# Patient Record
Sex: Male | Born: 1995 | Race: White | Hispanic: No | Marital: Single | Smoking: Never smoker
Health system: Southern US, Community
[De-identification: ages and names within clinical notes are randomized; demographics above are authoritative.]

## PROBLEM LIST (undated history)

## (undated) DIAGNOSIS — W3400XA Accidental discharge from unspecified firearms or gun, initial encounter: Secondary | ICD-10-CM

## (undated) DIAGNOSIS — G039 Meningitis, unspecified: Secondary | ICD-10-CM

---

## 2018-02-24 ENCOUNTER — Encounter (HOSPITAL_COMMUNITY): Payer: Self-pay | Admitting: Emergency Medicine

## 2018-02-24 ENCOUNTER — Ambulatory Visit (HOSPITAL_COMMUNITY)
Admission: EM | Admit: 2018-02-24 | Discharge: 2018-02-24 | Disposition: A | Discharge status: Home / Self Care | Payer: Self-pay | Attending: Internal Medicine | Admitting: Internal Medicine

## 2018-02-24 ENCOUNTER — Other Ambulatory Visit: Payer: Self-pay

## 2018-02-24 DIAGNOSIS — J069 Acute upper respiratory infection, unspecified: Secondary | ICD-10-CM

## 2018-02-24 DIAGNOSIS — B9789 Other viral agents as the cause of diseases classified elsewhere: Secondary | ICD-10-CM

## 2018-02-24 HISTORY — DX: Meningitis, unspecified: G03.9

## 2018-02-24 MED ORDER — CETIRIZINE HCL 10 MG PO CAPS: 10.0000 mg | ORAL_CAPSULE | Freq: Every day | ORAL | 0 refills | Status: AC

## 2018-02-24 MED ORDER — FLUTICASONE PROPIONATE 50 MCG/ACT NA SUSP: 1.0000 | Freq: Every day | NASAL | 0 refills | Status: AC

## 2018-02-24 MED ORDER — PSEUDOEPH-BROMPHEN-DM 30-2-10 MG/5ML PO SYRP: 5.0000 mL | ORAL_SOLUTION | Freq: Four times a day (QID) | ORAL | 0 refills | Status: AC | PRN

## 2018-02-24 NOTE — ED Provider Notes (Signed)
[facility]    CSN: [id number] Arrival date & time: 02/24/18  1217     History   Chief Complaint Chief Complaint  Patient presents with  . Cough    HPI Jonathon Ramirez is a 22 y.o. male Patient is presenting with URI symptoms- congestion, cough, minimal sore throat. Patient's main complaints are cough and congestion. Symptoms have been going on for 2 days. Patient has tried NyQuil, with minimal relief. Denies nausea, vomiting, diarrhea.  Endorsing subjective fevers.  Denies shortness of breath and chest pain.  Denies history of asthma or smoking.   HPI  Past Medical History:  Diagnosis Date  . Meningitis     There are no active problems to display for this patient.   History reviewed. No pertinent surgical history.     Home Medications    Prior to Admission medications   Medication Sig Start Date End Date Taking? Authorizing Provider  brompheniramine-pseudoephedrine-DM 30-2-10 MG/5ML syrup Take 5 mLs by mouth 4 (four) times daily as needed. 02/24/18   Andra Heslin C, PA-C  Cetirizine HCl 10 MG CAPS Take 1 capsule (10 mg total) by mouth daily for 15 days. 02/24/18 03/11/18  Tinya Cadogan C, PA-C  fluticasone (FLONASE) 50 MCG/ACT nasal spray Place 1-2 sprays into both nostrils daily for 7 days. 02/24/18 03/03/18  Shayra Anton, Junius CreamerHallie C, PA-C    Family History No family history on file.  Social History Social History   Tobacco Use  . Smoking status: Never Smoker  . Smokeless tobacco: Never Used  Substance Use Topics  . Alcohol use: Never    Frequency: Never  . Drug use: Never     Allergies   Vancomycin   Review of Systems Review of Systems  Constitutional: Positive for fever. Negative for activity change, appetite change and fatigue.  HENT: Positive for congestion, postnasal drip and rhinorrhea. Negative for ear pain, sinus pressure and sore throat.   Eyes: Negative for pain and itching.  Respiratory: Positive for cough. Negative for shortness of  breath.   Cardiovascular: Negative for chest pain.  Gastrointestinal: Negative for abdominal pain, diarrhea, nausea and vomiting.  Musculoskeletal: Negative for myalgias.  Skin: Negative for rash.  Neurological: Negative for dizziness, light-headedness and headaches.     Physical Exam Triage Vital Signs ED Triage Vitals  Enc Vitals Group     BP 02/24/18 1302 112/74     Pulse Rate 02/24/18 1302 (!) 115     Resp --      Temp 02/24/18 1302 98.4 F (36.9 C)     Temp Source 02/24/18 1302 Oral     SpO2 02/24/18 1302 98 %     Weight --      Height --      Head Circumference --      Peak Flow --      Pain Score 02/24/18 1300 0     Pain Loc --      Pain Edu? --      Excl. in GC? --    No data found.  Updated Vital Signs BP 112/74 (BP Location: Left Arm)   Pulse (!) 115   Temp 98.4 F (36.9 C) (Oral)   SpO2 98%   Visual Acuity Right Eye Distance:   Left Eye Distance:   Bilateral Distance:    Right Eye Near:   Left Eye Near:    Bilateral Near:     Physical Exam  Constitutional: He appears well-developed and well-nourished.  HENT:  Head: Normocephalic and atraumatic.  Bilateral TMs nonerythematous, nasal mucosa erythematous with swollen turbinates, clear rhinorrhea present in bilateral nares, posterior oropharynx erythematous, no tonsillar enlargement or exudate.  Eyes: Conjunctivae are normal.  Neck: Neck supple.  Cardiovascular: Normal rate and regular rhythm.  No murmur heard. Pulmonary/Chest: Effort normal and breath sounds normal. No respiratory distress.  Breathing comfortably at rest, CTA BL, no adventitious sounds auscultated.  Abdominal: Soft. There is no tenderness.  Musculoskeletal: He exhibits no edema.  Neurological: He is alert.  Skin: Skin is warm and dry.  Psychiatric: He has a normal mood and affect.  Nursing note and vitals reviewed.    UC Treatments / Results  Labs (all labs ordered are listed, but only abnormal results are displayed) Labs  Reviewed - No data to display  EKG None Radiology No results found.  Procedures Procedures (including critical care time)  Medications Ordered in UC Medications - No data to display   Initial Impression / Assessment and Plan / UC Course  I have reviewed the triage vital signs and the nursing notes.  Pertinent labs & imaging results that were available during my care of the patient were reviewed by me and considered in my medical decision making (see chart for details).     Patient likely with viral URI versus allergic rhinitis.  Will begin on daily Zyrtec, Flonase nasal spray.  Also provided cough syrup to use as needed for cough.  Patient is self-pay and, provided good Rx card but also discussed over-the-counter medications to use as alternative. Discussed strict return precautions. Patient verbalized understanding and is agreeable with plan.   Final Clinical Impressions(s) / UC Diagnoses   Final diagnoses:  Viral URI with cough    ED Discharge Orders        Ordered    Cetirizine HCl 10 MG CAPS  Daily     02/24/18 1321    fluticasone (FLONASE) 50 MCG/ACT nasal spray  Daily     02/24/18 1321    brompheniramine-pseudoephedrine-DM 30-2-10 MG/5ML syrup  4 times daily PRN     02/24/18 1321       Controlled Substance Prescriptions [place] Controlled Substance Registry consulted? Not Applicable   Lew Dawes, [place] 02/24/18 1325

## 2018-02-24 NOTE — ED Triage Notes (Signed)
C/o productive cough, head congestion with rhinitis onset 2 days

## 2018-02-24 NOTE — Discharge Instructions (Signed)
For congestion please begin daily allergy pill like Zyrtec, Claritin, Allegra or he may get store brand.  Please use in combination with nasal spray like Flonase or Nasacort.  Your cough I sent in a cough syrup for you, or you may try over-the-counter Delsym, Robitussin or Robitussin-DM.  Please also use honey mixed in warm tea to help with cough and relieve any throat irritation.  I expect your symptoms to gradually improve over the next week with consistent use of medications above.  Please return if symptoms worsening, developing new symptoms are not improving in 1 week.

## 2018-05-07 ENCOUNTER — Emergency Department (HOSPITAL_COMMUNITY): Payer: Self-pay

## 2018-05-07 ENCOUNTER — Emergency Department (HOSPITAL_COMMUNITY)
Admission: EM | Admit: 2018-05-07 | Discharge: 2018-05-08 | Disposition: A | Discharge status: Home / Self Care | Payer: Self-pay | Attending: Emergency Medicine | Admitting: Emergency Medicine

## 2018-05-07 ENCOUNTER — Encounter (HOSPITAL_COMMUNITY): Payer: Self-pay | Admitting: Emergency Medicine

## 2018-05-07 DIAGNOSIS — Y9279 Other farm location as the place of occurrence of the external cause: Secondary | ICD-10-CM | POA: Insufficient documentation

## 2018-05-07 DIAGNOSIS — Y9389 Activity, other specified: Secondary | ICD-10-CM | POA: Insufficient documentation

## 2018-05-07 DIAGNOSIS — S71132A Puncture wound without foreign body, left thigh, initial encounter: Secondary | ICD-10-CM | POA: Insufficient documentation

## 2018-05-07 DIAGNOSIS — Y998 Other external cause status: Secondary | ICD-10-CM | POA: Insufficient documentation

## 2018-05-07 DIAGNOSIS — W3400XA Accidental discharge from unspecified firearms or gun, initial encounter: Secondary | ICD-10-CM | POA: Insufficient documentation

## 2018-05-07 DIAGNOSIS — Z23 Encounter for immunization: Secondary | ICD-10-CM | POA: Insufficient documentation

## 2018-05-07 LAB — CBC WITH DIFFERENTIAL/PLATELET
Abs Immature Granulocytes: 0.1 10*3/uL (ref 0.0–0.1)
Basophils Absolute: 0.1 10*3/uL (ref 0.0–0.1)
Basophils Relative: 0 %
EOS PCT: 6 %
Eosinophils Absolute: 0.7 10*3/uL (ref 0.0–0.7)
HEMATOCRIT: 42.9 % (ref 39.0–52.0)
HEMOGLOBIN: 14.4 g/dL (ref 13.0–17.0)
Immature Granulocytes: 0 %
LYMPHS ABS: 4.9 10*3/uL — AB (ref 0.7–4.0)
LYMPHS PCT: 39 %
MCH: 32 pg (ref 26.0–34.0)
MCHC: 33.6 g/dL (ref 30.0–36.0)
MCV: 95.3 fL (ref 78.0–100.0)
MONOS PCT: 7 %
Monocytes Absolute: 0.9 10*3/uL (ref 0.1–1.0)
Neutro Abs: 5.9 10*3/uL (ref 1.7–7.7)
Neutrophils Relative %: 48 %
Platelets: 361 10*3/uL (ref 150–400)
RBC: 4.5 MIL/uL (ref 4.22–5.81)
RDW: 11.7 % (ref 11.5–15.5)
WBC: 12.5 10*3/uL — ABNORMAL HIGH (ref 4.0–10.5)

## 2018-05-07 LAB — I-STAT CHEM 8, ED
BUN: 15 mg/dL (ref 6–20)
CREATININE: 1 mg/dL (ref 0.61–1.24)
Calcium, Ion: 1.15 mmol/L (ref 1.15–1.40)
Chloride: 103 mmol/L (ref 98–111)
Glucose, Bld: 113 mg/dL — ABNORMAL HIGH (ref 70–99)
HEMATOCRIT: 42 % (ref 39.0–52.0)
Hemoglobin: 14.3 g/dL (ref 13.0–17.0)
Potassium: 3.6 mmol/L (ref 3.5–5.1)
Sodium: 139 mmol/L (ref 135–145)
TCO2: 25 mmol/L (ref 22–32)

## 2018-05-07 MED ORDER — IOPAMIDOL (ISOVUE-370) INJECTION 76%
100.0000 mL | Freq: Once | INTRAVENOUS | Status: AC | PRN
  Administered 2018-05-07: 100 mL via INTRAVENOUS

## 2018-05-07 MED ORDER — CEFAZOLIN SODIUM-DEXTROSE 2-4 GM/100ML-% IV SOLN
2.0000 g | Freq: Once | INTRAVENOUS | Status: AC
  Administered 2018-05-08: 2 g via INTRAVENOUS
  Filled 2018-05-07: qty 100

## 2018-05-07 MED ORDER — HYDROMORPHONE HCL 1 MG/ML IJ SOLN
1.0000 mg | Freq: Once | INTRAMUSCULAR | Status: DC
  Filled 2018-05-07: qty 1

## 2018-05-07 MED ORDER — TETANUS-DIPHTH-ACELL PERTUSSIS 5-2.5-18.5 LF-MCG/0.5 IM SUSP
0.5000 mL | Freq: Once | INTRAMUSCULAR | Status: AC
  Administered 2018-05-08: 0.5 mL via INTRAMUSCULAR
  Filled 2018-05-07: qty 0.5
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
## 2018-05-08 LAB — TYPE AND SCREEN
ABO/RH(D): A POS
ANTIBODY SCREEN: NEGATIVE

## 2018-05-08 LAB — ABO/RH: ABO/RH(D): A POS

## 2018-05-08 LAB — BASIC METABOLIC PANEL
ANION GAP: 8 (ref 5–15)
BUN: 14 mg/dL (ref 6–20)
CHLORIDE: 107 mmol/L (ref 98–111)
CO2: 22 mmol/L (ref 22–32)
Calcium: 9.1 mg/dL (ref 8.9–10.3)
Creatinine, Ser: 1.1 mg/dL (ref 0.61–1.24)
GFR calc Af Amer: >60 mL/min (ref >60)
GFR calc non Af Amer: >60 mL/min (ref >60)
GLUCOSE: 116 mg/dL — AB (ref 70–99)
Potassium: 3.6 mmol/L (ref 3.5–5.1)
Sodium: 137 mmol/L (ref 135–145)

## 2018-05-08 LAB — ETHANOL: Alcohol, Ethyl (B): <10 mg/dL (ref <10)

## 2018-05-08 MED ORDER — ONDANSETRON 4 MG PO TBDP: 4.0000 mg | ORAL_TABLET | Freq: Three times a day (TID) | ORAL | 0 refills | Status: AC | PRN

## 2018-05-08 MED ORDER — CEPHALEXIN 500 MG PO CAPS: 500.0000 mg | ORAL_CAPSULE | Freq: Three times a day (TID) | ORAL | 0 refills | Status: AC

## 2018-05-08 MED ORDER — HYDROCODONE-ACETAMINOPHEN 5-325 MG PO TABS
2.0000 | ORAL_TABLET | Freq: Once | ORAL | Status: AC
  Administered 2018-05-08: 2 via ORAL
  Filled 2018-05-08: qty 2

## 2018-05-08 MED ORDER — OXYCODONE-ACETAMINOPHEN 5-325 MG PO TABS: 1.0000 | ORAL_TABLET | ORAL | 0 refills | Status: AC | PRN

## 2018-05-08 NOTE — Discharge Instructions (Addendum)
I recommend cleaning the wound with soap and water at least once a day.  Then, apply a gauze followed by a compression dressing.  If you develop any new numbness, tingling, weakness of the leg, return to the ER immediately.

## 2018-05-15 ENCOUNTER — Encounter (HOSPITAL_COMMUNITY): Payer: Self-pay | Admitting: Emergency Medicine

## 2018-05-15 ENCOUNTER — Ambulatory Visit (HOSPITAL_COMMUNITY)
Admission: EM | Admit: 2018-05-15 | Discharge: 2018-05-15 | Disposition: A | Discharge status: Home / Self Care | Payer: Self-pay | Attending: Internal Medicine | Admitting: Internal Medicine

## 2018-05-15 DIAGNOSIS — T148XXA Other injury of unspecified body region, initial encounter: Secondary | ICD-10-CM

## 2018-05-15 DIAGNOSIS — L089 Local infection of the skin and subcutaneous tissue, unspecified: Secondary | ICD-10-CM

## 2018-05-15 HISTORY — DX: Accidental discharge from unspecified firearms or gun, initial encounter: W34.00XA

## 2018-05-15 MED ORDER — SULFAMETHOXAZOLE-TRIMETHOPRIM 800-160 MG PO TABS: 1.0000 | ORAL_TABLET | Freq: Two times a day (BID) | ORAL | 0 refills | Status: AC
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
## 2018-05-15 NOTE — Discharge Instructions (Signed)
Complete course of antibiotics.  Wash with soap and water daily, keep covered to keep clean.  If symptoms worsen or do not improve in the next 3-5 days to return to be seen or to follow up with your PCP.

## 2018-11-12 ENCOUNTER — Emergency Department (HOSPITAL_COMMUNITY)
Admission: EM | Admit: 2018-11-12 | Discharge: 2018-11-12 | Disposition: A | Discharge status: Home / Self Care | Payer: No Typology Code available for payment source | Attending: Emergency Medicine | Admitting: Emergency Medicine

## 2018-11-12 ENCOUNTER — Emergency Department (HOSPITAL_COMMUNITY): Payer: No Typology Code available for payment source

## 2018-11-12 ENCOUNTER — Encounter (HOSPITAL_COMMUNITY): Payer: Self-pay

## 2018-11-12 ENCOUNTER — Other Ambulatory Visit: Payer: Self-pay

## 2018-11-12 DIAGNOSIS — R109 Unspecified abdominal pain: Secondary | ICD-10-CM | POA: Insufficient documentation

## 2018-11-12 DIAGNOSIS — R51 Headache: Secondary | ICD-10-CM | POA: Diagnosis not present

## 2018-11-12 LAB — PROTIME-INR
INR: 1.03
Prothrombin Time: 13.4 seconds (ref 11.4–15.2)

## 2018-11-12 LAB — COMPREHENSIVE METABOLIC PANEL
ALT: 24 U/L (ref 0–44)
AST: 33 U/L (ref 15–41)
Albumin: 4.4 g/dL (ref 3.5–5.0)
Alkaline Phosphatase: 57 U/L (ref 38–126)
Anion gap: 13 (ref 5–15)
BUN: 14 mg/dL (ref 6–20)
CO2: 22 mmol/L (ref 22–32)
Calcium: 9.5 mg/dL (ref 8.9–10.3)
Chloride: 102 mmol/L (ref 98–111)
Creatinine, Ser: 1.06 mg/dL (ref 0.61–1.24)
GFR calc Af Amer: >60 mL/min (ref >60)
GFR calc non Af Amer: >60 mL/min (ref >60)
Glucose, Bld: 102 mg/dL — ABNORMAL HIGH (ref 70–99)
Potassium: 3.3 mmol/L — ABNORMAL LOW (ref 3.5–5.1)
Sodium: 137 mmol/L (ref 135–145)
Total Bilirubin: 0.6 mg/dL (ref 0.3–1.2)
Total Protein: 7.7 g/dL (ref 6.5–8.1)

## 2018-11-12 LAB — CBC
HCT: 45.5 % (ref 39.0–52.0)
Hemoglobin: 15.7 g/dL (ref 13.0–17.0)
MCH: 32.2 pg (ref 26.0–34.0)
MCHC: 34.5 g/dL (ref 30.0–36.0)
MCV: 93.4 fL (ref 80.0–100.0)
Platelets: 500 10*3/uL — ABNORMAL HIGH (ref 150–400)
RBC: 4.87 MIL/uL (ref 4.22–5.81)
RDW: 11.8 % (ref 11.5–15.5)
WBC: 13.3 10*3/uL — ABNORMAL HIGH (ref 4.0–10.5)
nRBC: 0 % (ref 0.0–0.2)

## 2018-11-12 LAB — SAMPLE TO BLOOD BANK

## 2018-11-12 LAB — I-STAT CG4 LACTIC ACID, ED: Lactic Acid, Venous: 2.15 mmol/L (ref 0.5–1.9)

## 2018-11-12 LAB — CDS SEROLOGY

## 2018-11-12 MED ORDER — FENTANYL CITRATE (PF) 100 MCG/2ML IJ SOLN
50.0000 ug | Freq: Once | INTRAMUSCULAR | Status: AC
  Administered 2018-11-12: 50 ug via INTRAVENOUS
  Filled 2018-11-12: qty 2

## 2018-11-12 MED ORDER — SODIUM CHLORIDE 0.9 % IV BOLUS
1000.0000 mL | Freq: Once | INTRAVENOUS | Status: AC
  Administered 2018-11-12: 1000 mL via INTRAVENOUS

## 2018-11-12 MED ORDER — IOHEXOL 300 MG/ML  SOLN
100.0000 mL | Freq: Once | INTRAMUSCULAR | Status: AC | PRN
  Administered 2018-11-12: 100 mL via INTRAVENOUS

## 2018-11-12 NOTE — ED Notes (Signed)
I-stat lactic acid results 2.15 reported to [facility], MD @ [phone] via face to face
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
## 2018-11-12 NOTE — ED Notes (Signed)
Pt taken to CT.
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
## 2018-11-12 NOTE — ED Notes (Signed)
Pt back from CT

## 2018-11-14 ENCOUNTER — Encounter (HOSPITAL_COMMUNITY): Payer: Self-pay | Admitting: Emergency Medicine
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
## 2022-12-26 ENCOUNTER — Ambulatory Visit
Admission: EM | Admit: 2022-12-26 | Discharge: 2022-12-26 | Disposition: A | Discharge status: Home / Self Care | Payer: BLUE CROSS/BLUE SHIELD | Attending: Nurse Practitioner | Admitting: Nurse Practitioner

## 2022-12-26 DIAGNOSIS — B349 Viral infection, unspecified: Secondary | ICD-10-CM | POA: Diagnosis present

## 2022-12-26 DIAGNOSIS — R0981 Nasal congestion: Secondary | ICD-10-CM | POA: Insufficient documentation

## 2022-12-26 DIAGNOSIS — Z1152 Encounter for screening for COVID-19: Secondary | ICD-10-CM | POA: Diagnosis not present

## 2022-12-26 DIAGNOSIS — R509 Fever, unspecified: Secondary | ICD-10-CM | POA: Diagnosis present

## 2022-12-26 LAB — POCT RAPID STREP A (OFFICE): Rapid Strep A Screen: NEGATIVE

## 2022-12-26 LAB — POCT INFLUENZA A/B
Influenza A, POC: NEGATIVE
Influenza B, POC: NEGATIVE

## 2022-12-26 MED ORDER — IPRATROPIUM BROMIDE 0.03 % NA SOLN: 2.0000 | Freq: Two times a day (BID) | NASAL | 0 refills | Status: AC

## 2022-12-26 NOTE — ED Triage Notes (Addendum)
Pt c/o of 2 days, nasal congestion, fever of 100.0 and scratchy throat. Home remedies: tylenol.
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
## 2022-12-26 NOTE — Discharge Instructions (Signed)
Your symptoms and exam are consistent for a viral illness. Please treat your symptoms with over the counter cough medication, tylenol or ibuprofen, humidifier, and rest.  Atrovent nasal spray as needed.  The clinic will contact you with results of your COVID test is positive.  Viral illnesses can last 7-14 days. Please follow up with your PCP if your symptoms are not improving. Please go to the ER for any worsening symptoms. This includes but is not limited to fever you can not control with tylenol or ibuprofen, you are not able to stay hydrated, you have shortness of breath or chest pain.  Thank you for choosing [facility] for your healthcare needs. I hope you feel better soon!

## 2022-12-27 LAB — SARS CORONAVIRUS 2 (TAT 6-24 HRS): SARS Coronavirus 2: NEGATIVE
